# Patient Record
Sex: Female | Born: 2005
Health system: Southern US, Community
[De-identification: ages and names within clinical notes are randomized; demographics above are authoritative.]

## PROBLEM LIST (undated history)

## (undated) DIAGNOSIS — B373 Candidiasis of vulva and vagina: Secondary | ICD-10-CM

## (undated) DIAGNOSIS — B3731 Acute candidiasis of vulva and vagina: Secondary | ICD-10-CM

## (undated) DIAGNOSIS — J309 Allergic rhinitis, unspecified: Secondary | ICD-10-CM

## (undated) DIAGNOSIS — R1084 Generalized abdominal pain: Secondary | ICD-10-CM

## (undated) DIAGNOSIS — K219 Gastro-esophageal reflux disease without esophagitis: Secondary | ICD-10-CM

## (undated) DIAGNOSIS — F32A Depression, unspecified: Secondary | ICD-10-CM

## (undated) DIAGNOSIS — R1319 Other dysphagia: Secondary | ICD-10-CM

## (undated) DIAGNOSIS — T7840XA Allergy, unspecified, initial encounter: Secondary | ICD-10-CM

## (undated) DIAGNOSIS — B359 Dermatophytosis, unspecified: Secondary | ICD-10-CM

## (undated) DIAGNOSIS — F329 Major depressive disorder, single episode, unspecified: Secondary | ICD-10-CM

## (undated) HISTORY — DX: Dermatophytosis, unspecified: B35.9

## (undated) HISTORY — DX: Acute candidiasis of vulva and vagina: B37.31

## (undated) HISTORY — DX: Allergy, unspecified, initial encounter: T78.40XA

## (undated) HISTORY — DX: Candidiasis of vulva and vagina: B37.3

## (undated) HISTORY — PX: TYMPANOPLASTY WITH GRAFT: SHX6567

## (undated) HISTORY — PX: TYMPANOSTOMY TUBE PLACEMENT: SHX32

---
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
## 2023-01-11 ENCOUNTER — Encounter: Payer: Self-pay | Admitting: Gastroenterology

## 2023-01-11 ENCOUNTER — Ambulatory Visit (INDEPENDENT_AMBULATORY_CARE_PROVIDER_SITE_OTHER): Payer: Medicaid Other | Admitting: Gastroenterology

## 2023-01-11 VITALS — BP 135/81 | HR 72 | Temp 98.4°F | Ht 64.0 in | Wt 171.5 lb

## 2023-01-11 DIAGNOSIS — R195 Other fecal abnormalities: Secondary | ICD-10-CM

## 2023-01-11 DIAGNOSIS — R197 Diarrhea, unspecified: Secondary | ICD-10-CM

## 2023-01-11 MED ORDER — NA SULFATE-K SULFATE-MG SULF 17.5-3.13-1.6 GM/177ML PO SOLN: 1.0000 | Freq: Once | ORAL | 0 refills | Status: AC

## 2023-01-11 NOTE — Addendum Note (Signed)
Addended by: Roena Malady on: 01/11/2023 04:50 PM   Modules accepted: Orders

## 2023-01-11 NOTE — Progress Notes (Signed)
Gastroenterology Consultation  Referring Provider:     Danelle Berry, PA-C Primary Care Physician:  Danelle Berry, PA-C Primary Gastroenterologist:  Dr. Servando Snare     Reason for Consultation:     Diarrhea and dysphagia        HPI:   Brandy Molina is a 17 y.o. y/o female referred for consultation & management of diarrhea and dysphagia by Dr. Danelle Berry, PA-C.  This patient comes in today after being seen by her primary care provider with dysphagia that she states has completely resolved.  The patient also had some lower abdominal pain and diarrhea and had a CT scan and fecal calprotectin sent off that showed:  IMPRESSION: 1. No acute intra-abdominal process. 2. Hepatic steatosis.  Component     Latest Ref Rng 11/23/2022  Calprotectin     mcg/g 140 (H)    The patient reports that her symptoms have greatly improved.  She does report that she has some bright red blood on the toilet paper but otherwise no blood mixed with the stool . Past Medical History:  Diagnosis Date   Allergy    Candidiasis, vulva    Dermatophytosis     Past Surgical History:  Procedure Laterality Date   TYMPANOPLASTY WITH GRAFT Left    TYMPANOSTOMY TUBE PLACEMENT Bilateral     Prior to Admission medications   Medication Sig Start Date End Date Taking? Authorizing Provider  fluconazole (DIFLUCAN) 150 MG tablet Take 1 tablet (150 mg total) by mouth every 3 (three) days as needed (for vaginal itching/yeast infection sx). 10/30/22   Berniece Salines, FNP  medroxyPROGESTERone (DEPO-PROVERA) 150 MG/ML injection Inject 1 mL (150 mg total) into the muscle every 3 (three) months. Patient not taking: Reported on 10/29/2022 07/21/21   Danelle Berry, PA-C  naproxen sodium (ALEVE) 220 MG tablet Take 1 tablet (220 mg total) by mouth 2 (two) times daily as needed. Start 2 Days before Menstrual Cycle begins Patient not taking: Reported on 10/29/2022 03/31/17   Doren Custard, FNP  omeprazole (PRILOSEC) 20 MG capsule Take 1  capsule (20 mg total) by mouth daily. 10/29/22   Berniece Salines, FNP  ondansetron (ZOFRAN-ODT) 4 MG disintegrating tablet Take 1 tablet (4 mg total) by mouth every 8 (eight) hours as needed for nausea or vomiting. 10/29/22   Berniece Salines, FNP  FLUoxetine (PROZAC) 10 MG tablet Take 1 tablet (10 mg total) by mouth daily. 06/14/19 06/04/20  Alba Cory, MD  Norgestimate-Ethinyl Estradiol Triphasic 0.18/0.215/0.25 MG-25 MCG tab Take 1 tablet by mouth daily. 06/14/19 06/04/20  Alba Cory, MD    Family History  Problem Relation Age of Onset   Hypertension Father    Depression Father    Depression Mother      Social History   Tobacco Use   Smoking status: Never   Smokeless tobacco: Never  Vaping Use   Vaping status: Every Day   Substances: Nicotine, Flavoring  Substance Use Topics   Alcohol use: No    Alcohol/week: 0.0 standard drinks of alcohol   Drug use: No    Allergies as of 01/11/2023 - Review Complete 10/29/2022  Allergen Reaction Noted   Amoxapine and related  11/30/2014   Amoxicillin  05/15/2015    Review of Systems:    All systems reviewed and negative except where noted in HPI.   Physical Exam:  There were no vitals taken for this visit. No LMP recorded. General:   Alert,  Well-developed, well-nourished, pleasant and cooperative in  NAD Head:  Normocephalic and atraumatic. Eyes:  Sclera clear, no icterus.   Conjunctiva pink. Ears:  Normal auditory acuity. Neurologic:  Alert and oriented x3;  grossly normal neurologically. Skin:  Intact without significant lesions or rashes.  No jaundice. Psych:  Alert and cooperative. Normal mood and affect.  Imaging Studies: No results found.  Assessment and Plan:   Brandy Molina is a 17 y.o. y/o female who comes in with a history of symptoms that sound like irritable bowel syndrome that have improved.  The patient also had dysphagia that has also improved.  The patient was found to have a positive fecal  calprotectin.  Because of the elevated fecal calprotectin the patient will be set up for colonoscopy.  The patient has been explained the plan and agrees with it.    Midge Minium, MD. Clementeen Graham    Note: This dictation was prepared with Dragon dictation along with smaller phrase technology. Any transcriptional errors that result from this process are unintentional.

## 2023-01-21 ENCOUNTER — Encounter: Payer: Self-pay | Admitting: Gastroenterology

## 2023-01-28 NOTE — Anesthesia Preprocedure Evaluation (Addendum)
Anesthesia Evaluation  Patient identified by MRN, date of birth, ID band Patient awake    Reviewed: Allergy & Precautions, H&P , NPO status , Patient's Chart, lab work & pertinent test results  Airway Mallampati: II  TM Distance: >3 FB Neck ROM: Full    Dental no notable dental hx.    Pulmonary Current Smoker   Pulmonary exam normal breath sounds clear to auscultation       Cardiovascular negative cardio ROS Normal cardiovascular exam Rhythm:Regular Rate:Normal     Neuro/Psych  PSYCHIATRIC DISORDERS  Depression    negative neurological ROS  negative psych ROS   GI/Hepatic Neg liver ROS,GERD  ,,  Endo/Other  negative endocrine ROS    Renal/GU negative Renal ROS  negative genitourinary   Musculoskeletal negative musculoskeletal ROS (+)    Abdominal   Peds negative pediatric ROS (+)  Hematology negative hematology ROS (+)   Anesthesia Other Findings Allergy  Dermatophytosis  GERD (gastroesophageal reflux disease) Depression  Allergic rhinitis Esophageal dysphagia  Generalized abdominal pain Major depressive disorder Vapes and uses marijuana, used THC on Saturday, vaped today    Reproductive/Obstetrics negative OB ROS                              Anesthesia Physical Anesthesia Plan  ASA: 2  Anesthesia Plan:    Post-op Pain Management:    Induction:   PONV Risk Score and Plan:   Airway Management Planned:   Additional Equipment:   Intra-op Plan:   Post-operative Plan:   Informed Consent:   Plan Discussed with:   Anesthesia Plan Comments:          Anesthesia Quick Evaluation

## 2023-01-29 ENCOUNTER — Encounter: Payer: Self-pay | Admitting: Gastroenterology

## 2023-02-01 ENCOUNTER — Other Ambulatory Visit: Payer: Self-pay

## 2023-02-01 ENCOUNTER — Encounter
Admission: RE | Disposition: A | Discharge status: Home / Self Care | Payer: Self-pay | Source: Home / Self Care | Attending: Gastroenterology

## 2023-02-01 ENCOUNTER — Ambulatory Visit
Admission: RE | Admit: 2023-02-01 | Discharge: 2023-02-01 | Disposition: A | Discharge status: Home / Self Care | Payer: 59 | Attending: Gastroenterology | Admitting: Gastroenterology

## 2023-02-01 ENCOUNTER — Ambulatory Visit: Payer: 59 | Admitting: Anesthesiology

## 2023-02-01 ENCOUNTER — Encounter: Payer: Self-pay | Admitting: Gastroenterology

## 2023-02-01 DIAGNOSIS — F329 Major depressive disorder, single episode, unspecified: Secondary | ICD-10-CM | POA: Insufficient documentation

## 2023-02-01 DIAGNOSIS — F17290 Nicotine dependence, other tobacco product, uncomplicated: Secondary | ICD-10-CM | POA: Diagnosis not present

## 2023-02-01 DIAGNOSIS — K219 Gastro-esophageal reflux disease without esophagitis: Secondary | ICD-10-CM | POA: Insufficient documentation

## 2023-02-01 DIAGNOSIS — F32A Depression, unspecified: Secondary | ICD-10-CM | POA: Insufficient documentation

## 2023-02-01 DIAGNOSIS — K6389 Other specified diseases of intestine: Secondary | ICD-10-CM | POA: Diagnosis not present

## 2023-02-01 DIAGNOSIS — R197 Diarrhea, unspecified: Secondary | ICD-10-CM

## 2023-02-01 DIAGNOSIS — R195 Other fecal abnormalities: Secondary | ICD-10-CM

## 2023-02-01 DIAGNOSIS — F17210 Nicotine dependence, cigarettes, uncomplicated: Secondary | ICD-10-CM | POA: Diagnosis not present

## 2023-02-01 DIAGNOSIS — K641 Second degree hemorrhoids: Secondary | ICD-10-CM | POA: Insufficient documentation

## 2023-02-01 HISTORY — PX: COLONOSCOPY WITH PROPOFOL: SHX5780

## 2023-02-01 HISTORY — DX: Other dysphagia: R13.19

## 2023-02-01 HISTORY — PX: BIOPSY: SHX5522

## 2023-02-01 HISTORY — DX: Depression, unspecified: F32.A

## 2023-02-01 HISTORY — DX: Major depressive disorder, single episode, unspecified: F32.9

## 2023-02-01 HISTORY — DX: Allergic rhinitis, unspecified: J30.9

## 2023-02-01 HISTORY — DX: Generalized abdominal pain: R10.84

## 2023-02-01 HISTORY — DX: Gastro-esophageal reflux disease without esophagitis: K21.9

## 2023-02-01 LAB — POCT PREGNANCY, URINE: Preg Test, Ur: NEGATIVE

## 2023-02-01 SURGERY — COLONOSCOPY WITH PROPOFOL
Anesthesia: General | Site: Rectum

## 2023-02-01 MED ORDER — LIDOCAINE HCL (CARDIAC) PF 100 MG/5ML IV SOSY
PREFILLED_SYRINGE | INTRAVENOUS | Status: DC | PRN
  Administered 2023-02-01: 60 mg via INTRAVENOUS

## 2023-02-01 MED ORDER — LACTATED RINGERS IV SOLN: INTRAVENOUS | Status: DC

## 2023-02-01 MED ORDER — SODIUM CHLORIDE 0.9 % IV SOLN: INTRAVENOUS | Status: DC

## 2023-02-01 MED ORDER — PROPOFOL 10 MG/ML IV BOLUS
INTRAVENOUS | Status: DC | PRN
  Administered 2023-02-01: 80 mg via INTRAVENOUS

## 2023-02-01 MED ORDER — STERILE WATER FOR IRRIGATION IR SOLN
Status: DC | PRN
  Administered 2023-02-01: 1

## 2023-02-01 MED ORDER — PROPOFOL 500 MG/50ML IV EMUL
INTRAVENOUS | Status: DC | PRN
  Administered 2023-02-01: 140 ug/kg/min via INTRAVENOUS

## 2023-02-01 MED ORDER — DEXMEDETOMIDINE HCL IN NACL 80 MCG/20ML IV SOLN
INTRAVENOUS | Status: DC | PRN
  Administered 2023-02-01: 8 ug via INTRAVENOUS

## 2023-02-01 SURGICAL SUPPLY — 21 items

## 2023-02-01 NOTE — H&P (Signed)
Brandy Minium, MD [facility] [address], [address] [place], [place] [address] Phone:[phone] Fax : [phone]  Primary Care Physician:  Brandy Berry, PA-C Primary Gastroenterologist:  Dr. Servando Molina  Pre-Procedure History & Physical: HPI:  Brandy Molina is a 17 y.o. female is here for an colonoscopy.   Past Medical History:  Diagnosis Date   Allergic rhinitis    Allergy    Candidiasis, vulva    Depression    Dermatophytosis    Esophageal dysphagia    Generalized abdominal pain    GERD (gastroesophageal reflux disease)    Major depressive disorder     Past Surgical History:  Procedure Laterality Date   TYMPANOPLASTY WITH GRAFT Left    TYMPANOSTOMY TUBE PLACEMENT Bilateral     Prior to Admission medications   Medication Sig Start Date End Date Taking? Authorizing Provider  ondansetron (ZOFRAN-ODT) 4 MG disintegrating tablet Take 1 tablet (4 mg total) by mouth every 8 (eight) hours as needed for nausea or vomiting. 10/29/22  Yes Brandy Salines, FNP  omeprazole (PRILOSEC) 20 MG capsule Take 1 capsule (20 mg total) by mouth daily. Patient not taking: Reported on 01/21/2023 10/29/22   Brandy Salines, FNP  FLUoxetine (PROZAC) 10 MG tablet Take 1 tablet (10 mg total) by mouth daily. 06/14/19 06/04/20  Brandy Cory, MD  Norgestimate-Ethinyl Estradiol Triphasic 0.18/0.215/0.25 MG-25 MCG tab Take 1 tablet by mouth daily. 06/14/19 06/04/20  Brandy Cory, MD    Allergies as of 01/11/2023 - Review Complete 01/11/2023  Allergen Reaction Noted   Amoxapine and related  11/30/2014   Amoxicillin  05/15/2015    Family History  Problem Relation Age of Onset   Hypertension Father    Depression Father    Depression Mother     Social History   Socioeconomic History   Marital status: Single    Spouse name: Not on file   Number of children: 0   Years of education: Not on file   Highest education level: Not on file  Occupational History   Occupation: student   Tobacco Use    Smoking status: Every Day    Current packs/day: 0.05    Average packs/day: 0.1 packs/day for 1 year (0.1 ttl pk-yrs)    Types: Cigarettes    Start date: 01/14/2022   Smokeless tobacco: Never  Vaping Use   Vaping status: Every Day   Substances: Nicotine, Flavoring  Substance and Sexual Activity   Alcohol use: No    Alcohol/week: 0.0 standard drinks of alcohol   Drug use: No   Sexual activity: Yes  Other Topics Concern   Not on file  Social History Narrative   Lives with parents and sister   Started working as Production assistant, radio at age 75       Social Determinants of Health   Financial Resource Strain: Low Risk  (07/21/2021)   Overall Financial Resource Strain (CARDIA)    Difficulty of Paying Living Expenses: Not hard at all  Food Insecurity: No Food Insecurity (07/21/2021)   Hunger Vital Sign    Worried About Running Out of Food in the Last Year: Never true    Ran Out of Food in the Last Year: Never true  Transportation Needs: No Transportation Needs (07/21/2021)   PRAPARE - Administrator, Civil Service (Medical): No    Lack of Transportation (Non-Medical): No  Physical Activity: Sufficiently Active (07/21/2021)   Exercise Vital Sign    Days of Exercise per Week: 5 days    Minutes  of Exercise per Session: 30 min  Stress: No Stress Concern Present (07/21/2021)   [employer] of Occupational Health - Occupational Stress Questionnaire    Feeling of Stress : Not at all  Social Connections: Unknown (07/21/2021)   Social Connection and Isolation Panel [NHANES]    Frequency of Communication with Friends and Family: Never    Frequency of Social Gatherings with Friends and Family: Never    Attends Religious Services: Never    Database administrator or Organizations: No    Attends Banker Meetings: Never    Marital Status: Not on file  Intimate Partner Violence: Not At Risk (07/21/2021)   Humiliation, Afraid, Rape, and Kick questionnaire    Fear of Current or Ex-Partner: No     Emotionally Abused: No    Physically Abused: No    Sexually Abused: No    Review of Systems: See HPI, otherwise negative ROS  Physical Exam: BP 111/74   Pulse (!) 26   Temp 98.8 F (37.1 C) (Temporal)   Ht 5' 4.02" (1.626 m)   Wt 76.5 kg   LMP 01/04/2023 (Approximate)   SpO2 97%   BMI 28.94 kg/m  General:   Alert,  pleasant and cooperative in NAD Head:  Normocephalic and atraumatic. Neck:  Supple; no masses or thyromegaly. Lungs:  Clear throughout to auscultation.    Heart:  Regular rate and rhythm. Abdomen:  Soft, nontender and nondistended. Normal bowel sounds, without guarding, and without rebound.   Neurologic:  Alert and  oriented x4;  grossly normal neurologically.  Impression/Plan: Brandy Molina is here for an colonoscopy to be performed for diarrhea  Risks, benefits, limitations, and alternatives regarding  colonoscopy have been reviewed with the patient.  Questions have been answered.  All parties agreeable.   Brandy Minium, MD  02/01/2023, 9:43 AM

## 2023-02-01 NOTE — Anesthesia Postprocedure Evaluation (Signed)
Anesthesia Post Note  Patient: Brandy Molina  Procedure(s) Performed: COLONOSCOPY WITH PROPOFOL BIOPSY (Rectum)  Patient location during evaluation: PACU Anesthesia Type: General Level of consciousness: awake and alert Pain management: pain level controlled Vital Signs Assessment: post-procedure vital signs reviewed and stable Respiratory status: spontaneous breathing, nonlabored ventilation, respiratory function stable and patient connected to nasal cannula oxygen Cardiovascular status: blood pressure returned to baseline and stable Postop Assessment: no apparent nausea or vomiting Anesthetic complications: no   No notable events documented.   Last Vitals:  Vitals:   02/01/23 1030 02/01/23 1034  BP: 105/65 113/65  Pulse: (!) 54 (!) 55  Resp: 19 20  Temp:    SpO2: 98%     Last Pain:  Vitals:   02/01/23 1018  TempSrc: Temporal  PainSc:                  Marisue Humble

## 2023-02-01 NOTE — Transfer of Care (Signed)
Immediate Anesthesia Transfer of Care Note  Patient: Brandy Molina  Procedure(s) Performed: COLONOSCOPY WITH PROPOFOL BIOPSY (Rectum)  Patient Location: PACU  Anesthesia Type:General  Level of Consciousness: awake, alert , and oriented  Airway & Oxygen Therapy: Patient Spontanous Breathing  Post-op Assessment: Report given to RN and Post -op Vital signs reviewed and stable  Post vital signs: Reviewed and stable  Last Vitals:  Vitals Value Taken Time  BP 92/54 02/01/23 1018  Temp 36.6 C 02/01/23 1018  Pulse 55 02/01/23 1027  Resp 19 02/01/23 1027  SpO2 98 % 02/01/23 1027  Vitals shown include unfiled device data.  Last Pain:  Vitals:   02/01/23 1018  TempSrc: Temporal  PainSc:          Complications: No notable events documented.

## 2023-02-01 NOTE — Op Note (Signed)
[facility] Gastroenterology Patient Name: Brandy Molina Procedure Date: 02/01/2023 9:56 AM MRN: [id number] Account #: [id number] Date of Birth: 05-27-2005 Admit Type: Outpatient Age: 17 Room: [facility] OR ROOM 01 Gender: Female Note Status: Finalized Instrument Name: [id number] Procedure:             Colonoscopy Indications:           Change in bowel habits Providers:             Midge Minium MD, MD Referring MD:          Danelle Berry (Referring MD) Medicines:             Propofol per Anesthesia Complications:         No immediate complications. Procedure:             Pre-Anesthesia Assessment:                        - Prior to the procedure, a History and Physical was                         performed, and patient medications and allergies were                         reviewed. The patient's tolerance of previous                         anesthesia was also reviewed. The risks and benefits                         of the procedure and the sedation options and risks                         were discussed with the patient. All questions were                         answered, and informed consent was obtained. Prior                         Anticoagulants: The patient has taken no anticoagulant                         or antiplatelet agents. ASA Grade Assessment: II - A                         patient with mild systemic disease. After reviewing                         the risks and benefits, the patient was deemed in                         satisfactory condition to undergo the procedure.                        After obtaining informed consent, the colonoscope was                         passed under direct vision. Throughout the procedure,  the patient's blood pressure, pulse, and oxygen                         saturations were monitored continuously. The                         Colonoscope was introduced through the anus and                          advanced to the the terminal ileum. The colonoscopy                         was performed without difficulty. The patient                         tolerated the procedure well. The quality of the bowel                         preparation was good. Findings:      The perianal and digital rectal examinations were normal.      The terminal ileum appeared normal. Biopsies were taken with a cold       forceps for histology.      Non-bleeding internal hemorrhoids were found during retroflexion. The       hemorrhoids were Grade II (internal hemorrhoids that prolapse but reduce       spontaneously).      Random biopsies were obtained with cold forceps for histology randomly       in the entire colon. Impression:            - The examined portion of the ileum was normal.                         Biopsied.                        - Non-bleeding internal hemorrhoids.                        - Random biopsies were obtained in the entire colon. Recommendation:        - Discharge patient to home.                        - Resume previous diet.                        - Continue present medications.                        - Await pathology results. Procedure Code(s):     --- Professional ---                        [phone], Colonoscopy, flexible; with biopsy, single or                         multiple Diagnosis Code(s):     --- Professional ---                        R19.4, Change in bowel habit CPT copyright 2022 American Medical Association. All rights reserved. The  codes documented in this report are preliminary and upon coder review may  be revised to meet current compliance requirements. Midge Minium MD, MD 02/01/2023 10:14:22 AM This report has been signed electronically. Number of Addenda: 0 Note Initiated On: 02/01/2023 9:56 AM Scope Withdrawal Time: 0 hours 6 minutes 54 seconds  Total Procedure Duration: 0 hours 9 minutes 51 seconds  Estimated Blood Loss:  Estimated blood loss: none.       [facility]

## 2023-02-02 ENCOUNTER — Encounter: Payer: Self-pay | Admitting: Gastroenterology

## 2023-02-03 LAB — SURGICAL PATHOLOGY

## 2023-02-04 ENCOUNTER — Encounter: Payer: Self-pay | Admitting: Gastroenterology

## 2023-09-09 NOTE — Progress Notes (Signed)
 Assessment and Plan:   Brandy Molina was seen today for establish care.  Diagnoses and all orders for this visit:  Dysmenorrhea Counseling for birth control, oral contraceptives History of ovarian cyst         -     Hx of 3.5 cm complex right ovarian cyst with intermediate internal density, most likely a hemorrhagic cyst. Noted on CT in 2024       -     Given patient's heavy, painful periods--referral placed for pelvic ultrasound to evaluate condition further.       -     Nonbiased contraceptive counseling provided to patient.  After patient centered discussion on risks, benefits and indications of various methods--patient agreeable to start contraceptive patch.       -     Reviewed dosing, side effects. -     US  Endovaginal (Non-OB); Future -     norelgestromin -ethinyl estradiol  (XULANE) 150-35 mcg/24 hr; Place 1 patch on the skin once a week.  Fatty liver        -     Advised cutting back on fatty foods, processed foods.  Increasing activity, decreasing sodas and sugar sweetened beverages. -     Labs: Comprehensive Metabolic Panel, A1c, lipid       -     ALT 33, lipase 179, A1c in past--negative  Encounter for hepatitis C screening test for low risk patient  -     Hepatitis C antibody  Screening examination for STD (sexually transmitted disease)        -     Tests for HIV, RPR, NAAT: : pending. Increase safe sex practices. RTC new partners, new symptoms, or concerns.   -     Chlamydia/Gonorrhoeae NAA -     HIV Antigen/Antibody Combo -     Syphilis Screen  History of depression        -Patient feels mental health stable.  Declined referral to mental health professional--continue to monitor.  Patient feels symptomatic from potential pinworm infection, advised over-the-counter treatment if needed. Information provided. Barriers to recommended plan: None identified  Return for 3-4 months .   Subjective:   HPI: Brandy Molina is a 18 y.o. female here for  Establish Care (Believes they may have pin worm. Work with children).  History of Present Illness   Brandy Molina is a 18 year old female who presents to establish care and discuss concerns about pinworm exposure and menstrual irregularities.  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 There are no known outbreaks at her workplace, but she is worried about contracting it from the children. She inquires about symptoms and over-the-counter treatments for pinworm, noting that she has not experienced any symptoms such as rectal itching or visible worms in her stool.  Dysmenorrhea: She has a history of painful and heavy menstrual periods, which have been problematic since she turned 16. Her periods are irregular, with some months missed and others occurring twice. The pain is most severe between the first and third day of her cycle, radiating to her lower back, and is accompanied by heavy bleeding that sometimes results in bleeding through her clothes. She experiences nausea and has previously vomited due to the pain. She manages the pain by sitting in a bath and occasionally uses ibuprofen , which provides minimal relief. She misses about three to four hours of work due to the pain. In past, pt has not  tried a hormonal method to control symptoms, but open to trial ling a method.  She has a history of a CT scan revealing fatty liver and a colonoscopy that was negative for endometriosis. She reports that her CT scan showed fatty liver, and she was not told of any cysts on her ovaries. She is interested in exploring options to manage her menstrual symptoms.  Her social history includes daily marijuana use, primarily to manage period pain and stomach issues, and occasional use of delta-8 THC. She also vapes flavored nicotine. She reports a diet high in junk food and soda, with occasional fruits and vegetables. She has a history of  inconsistent bowel movements, with frequent diarrhea and occasional solid stools.      1 female partner in past 36M--would like STD screening Hx of depression: pt states she has hx of depression, feels mental health stable, declined referral to mental health professional   I have reviewed past medical, surgical, medications, allergies, social and family histories today and updated them in Epic where appropriate.  ROS:  Review of Systems  Constitutional: Negative.   HENT: Negative.    Respiratory: Negative.    Cardiovascular: Negative.   Gastrointestinal:  Positive for diarrhea.  Genitourinary:  Positive for pelvic pain and vaginal bleeding.  Psychiatric/Behavioral: Negative.    All other systems reviewed and are negative.    Wt Readings from Last 3 Encounters:  09/09/23 67.5 kg (148 lb 14.4 oz)         Review of systems negative unless otherwise noted as per HPI.    Objective:   Vitals:   09/09/23 1041  BP: 114/70  Pulse: 64  Temp: 36.6 C (97.8 F)  SpO2: 98%   There is no height or weight on file to calculate BMI.  Physical Exam Vitals and nursing note reviewed.  Constitutional:      Appearance: Normal appearance.  HENT:     Head: Normocephalic and atraumatic.   Musculoskeletal:        General: Normal range of motion.   Skin:    General: Skin is warm.     Capillary Refill: Capillary refill takes less than 2 seconds.   Neurological:     General: No focal deficit present.     Mental Status: She is alert and oriented to person, place, and time. Mental status is at baseline.   Psychiatric:        Mood and Affect: Mood normal.        Behavior: Behavior normal.        Thought Content: Thought content normal.        Judgment: Judgment normal.       Medication adherence and barriers to the treatment plan have been addressed. Opportunities to optimize healthy behaviors have been discussed. Patient / caregiver voiced understanding.  I personally spent 30 minutes  face-to-face and non-face-to-face in the care of this patient, which includes all pre, intra, and post visit time on the date of service. Alfonso Grow, DNP, FNP-C [facility] [phone] (F)  Note - This record has been created using [employer]. Chart creation errors have been sought, but may not always have been located. Such creation errors do not reflect on the standard of medical care.

## 2023-10-22 ENCOUNTER — Encounter: Payer: Self-pay | Admitting: Emergency Medicine

## 2023-10-22 ENCOUNTER — Ambulatory Visit
Admission: EM | Admit: 2023-10-22 | Discharge: 2023-10-22 | Disposition: A | Discharge status: Home / Self Care | Attending: Emergency Medicine | Admitting: Emergency Medicine

## 2023-10-22 DIAGNOSIS — R1319 Other dysphagia: Secondary | ICD-10-CM | POA: Insufficient documentation

## 2023-10-22 DIAGNOSIS — N898 Other specified noninflammatory disorders of vagina: Secondary | ICD-10-CM | POA: Insufficient documentation

## 2023-10-22 DIAGNOSIS — R300 Dysuria: Secondary | ICD-10-CM | POA: Diagnosis present

## 2023-10-22 DIAGNOSIS — Z112 Encounter for screening for other bacterial diseases: Secondary | ICD-10-CM | POA: Insufficient documentation

## 2023-10-22 DIAGNOSIS — J309 Allergic rhinitis, unspecified: Secondary | ICD-10-CM | POA: Insufficient documentation

## 2023-10-22 DIAGNOSIS — F32A Depression, unspecified: Secondary | ICD-10-CM | POA: Diagnosis not present

## 2023-10-22 DIAGNOSIS — K219 Gastro-esophageal reflux disease without esophagitis: Secondary | ICD-10-CM | POA: Diagnosis not present

## 2023-10-22 DIAGNOSIS — Z202 Contact with and (suspected) exposure to infections with a predominantly sexual mode of transmission: Secondary | ICD-10-CM | POA: Diagnosis present

## 2023-10-22 DIAGNOSIS — R1084 Generalized abdominal pain: Secondary | ICD-10-CM | POA: Diagnosis not present

## 2023-10-22 DIAGNOSIS — Z113 Encounter for screening for infections with a predominantly sexual mode of transmission: Secondary | ICD-10-CM | POA: Diagnosis not present

## 2023-10-22 LAB — URINALYSIS, W/ REFLEX TO CULTURE (INFECTION SUSPECTED)
Glucose, UA: NEGATIVE mg/dL
Hgb urine dipstick: NEGATIVE
Ketones, ur: NEGATIVE mg/dL
Leukocytes,Ua: NEGATIVE
Nitrite: NEGATIVE
Specific Gravity, Urine: >1.03 — ABNORMAL HIGH (ref 1.005–1.030)
Squamous Epithelial / HPF: >50 /HPF (ref 0–5)
pH: 5.5 (ref 5.0–8.0)

## 2023-10-22 LAB — HIV ANTIBODY (ROUTINE TESTING W REFLEX): HIV Screen 4th Generation wRfx: NONREACTIVE

## 2023-10-22 NOTE — ED Triage Notes (Signed)
 Pt c/o possible chlamydia.  Pt states that she feels off  Pt states that she has a new sexual partner and is unsure if he is having any symptoms.  Pt states that she has vaginal itching, burning, and urinary frequency.  Pt states that she has had chlamydia before and her symptoms are the same.

## 2023-10-22 NOTE — Discharge Instructions (Addendum)
 Your urinalysis did not show any evidence of UTI.  Your vaginal swab is pending and will be back in the next several days.  Your blood work is also pending and likewise will be back in the next several days.  If you test positive for any infection you will be contacted by phone and treatment options will be provided.  If all of your results are negative they will appear in your MyChart.  Abstain from intercourse, or at least unprotected intercourse until after your test results are back and any treatment has been completed.  Your partner needs to also be tested and treated if they are positive for an infection.  If you have any increase in pain, develop nausea or vomiting, or develop fever you need to go to the ER for evaluation.

## 2023-10-22 NOTE — ED Provider Notes (Signed)
 [facility]    CSN: [id number] Arrival date & time: 10/22/23  0912      History   Chief Complaint Chief Complaint  Patient presents with   Exposure to STD    HPI Brandy Molina is a 18 y.o. female.   HPI  18 year old female with past medical history significant for MDD, GERD, generalized abdominal pain, esophageal dysphagia, depression, and allergic rhinitis presents for evaluation of genitourinary symptoms that been going on for the past few days this includes some mild dysuria along with urinary urgency and frequency but no hematuria.  She does report that 1 day when she wiped that she was spotting but it was only 1 episode and has not returned.  She is experiencing a white vaginal discharge and denies any odor.  She has had chlamydia before and she reports that she feels off she would like to be checked for chlamydia.  She has a new sexual partner who told her that he was free of infection.  The had 1 episode of unprotected sex.  She denies any fever, abdominal pain, nausea, or vomiting.  No recent hot tub exposure, antibiotic use, or bubble bath.  Past Medical History:  Diagnosis Date   Allergic rhinitis    Allergy    Candidiasis, vulva    Depression    Dermatophytosis    Esophageal dysphagia    Generalized abdominal pain    GERD (gastroesophageal reflux disease)    Major depressive disorder     Patient Active Problem List   Diagnosis Date Noted   Elevated fecal calprotectin 02/01/2023   Diarrhea 02/01/2023   Major depression 08/15/2020   Dysmenorrhea in adolescent 06/03/2018   Allergic rhinitis 05/15/2015    Past Surgical History:  Procedure Laterality Date   BIOPSY  02/01/2023   Procedure: BIOPSY;  Surgeon: Jinny Carmine, MD;  Location: [facility];  Service: Endoscopy;;   COLONOSCOPY WITH PROPOFOL  N/A 02/01/2023   Procedure: COLONOSCOPY WITH PROPOFOL ;  Surgeon: Jinny Carmine, MD;  Location: [facility];  Service: Endoscopy;   Laterality: N/A;   TYMPANOPLASTY WITH GRAFT Left    TYMPANOSTOMY TUBE PLACEMENT Bilateral     OB History   No obstetric history on file.      Home Medications    Prior to Admission medications   Medication Sig Start Date End Date Taking? Authorizing Provider  omeprazole  (PRILOSEC) 20 MG capsule Take 1 capsule (20 mg total) by mouth daily. 10/29/22  Yes Gareth Mliss FALCON, FNP  ondansetron  (ZOFRAN -ODT) 4 MG disintegrating tablet Take 1 tablet (4 mg total) by mouth every 8 (eight) hours as needed for nausea or vomiting. 10/29/22  Yes Gareth Mliss FALCON, FNP  FLUoxetine  (PROZAC ) 10 MG tablet Take 1 tablet (10 mg total) by mouth daily. 06/14/19 06/04/20  Sowles, Krichna, MD  Norgestimate-Ethinyl Estradiol  Triphasic 0.18/0.215/0.25 MG-25 MCG tab Take 1 tablet by mouth daily. 06/14/19 06/04/20  Sowles, Krichna, MD    Family History Family History  Problem Relation Age of Onset   Hypertension Father    Depression Father    Depression Mother     Social History Social History   Tobacco Use   Smoking status: Every Day    Current packs/day: 0.05    Average packs/day: 0.1 packs/day for 1.8 years (0.1 ttl pk-yrs)    Types: E-cigarettes, Cigarettes    Start date: 01/14/2022   Smokeless tobacco: Never  Vaping Use   Vaping status: Every Day   Substances: Nicotine, Flavoring  Substance Use Topics  Alcohol use: No    Alcohol/week: 0.0 standard drinks of alcohol   Drug use: No     Allergies   Amoxapine and related and Amoxicillin   Review of Systems Review of Systems  Constitutional:  Negative for fever.  Gastrointestinal:  Negative for abdominal pain, nausea and vomiting.  Genitourinary:  Positive for dysuria, frequency, urgency, vaginal discharge and vaginal pain. Negative for hematuria.  Musculoskeletal:  Negative for back pain.  Skin:  Negative for rash.     Physical Exam Triage Vital Signs ED Triage Vitals  Encounter Vitals Group     BP      Girls Systolic BP Percentile       Girls Diastolic BP Percentile      Boys Systolic BP Percentile      Boys Diastolic BP Percentile      Pulse      Resp      Temp      Temp src      SpO2      Weight      Height      Head Circumference      Peak Flow      Pain Score      Pain Loc      Pain Education      Exclude from Growth Chart    No data found.  Updated Vital Signs BP 115/74 (BP Location: Left Arm)   Pulse 60   Temp 97.8 F (36.6 C) (Oral)   Ht 5' 4 (1.626 m)   Wt 140 lb (63.5 kg)   LMP 10/08/2023   SpO2 97%   BMI 24.03 kg/m   Visual Acuity Right Eye Distance:   Left Eye Distance:   Bilateral Distance:    Right Eye Near:   Left Eye Near:    Bilateral Near:     Physical Exam Vitals and nursing note reviewed.  Constitutional:      Appearance: Normal appearance. She is not ill-appearing.  HENT:     Head: Normocephalic and atraumatic.  Cardiovascular:     Rate and Rhythm: Normal rate and regular rhythm.     Pulses: Normal pulses.     Heart sounds: Normal heart sounds. No murmur heard.    No friction rub. No gallop.  Pulmonary:     Effort: Pulmonary effort is normal.     Breath sounds: Normal breath sounds. No wheezing, rhonchi or rales.  Abdominal:     General: Abdomen is flat.     Palpations: Abdomen is soft.     Tenderness: There is no abdominal tenderness. There is no right CVA tenderness, left CVA tenderness, guarding or rebound.  Skin:    General: Skin is warm and dry.     Capillary Refill: Capillary refill takes less than 2 seconds.     Findings: No rash.  Neurological:     General: No focal deficit present.     Mental Status: She is alert and oriented to person, place, and time.      UC Treatments / Results  Labs (all labs ordered are listed, but only abnormal results are displayed) Labs Reviewed  URINALYSIS, W/ REFLEX TO CULTURE (INFECTION SUSPECTED) - Abnormal; Notable for the following components:      Result Value   APPearance HAZY (*)    Specific Gravity, Urine  >1.030 (*)    Bilirubin Urine SMALL (*)    Protein, ur TRACE (*)    Bacteria, UA MANY (*)    All other components  within normal limits  HIV ANTIBODY (ROUTINE TESTING W REFLEX)  RPR  CERVICOVAGINAL ANCILLARY ONLY    EKG   Radiology No results found.  Procedures Procedures (including critical care time)  Medications Ordered in UC Medications - No data to display  Initial Impression / Assessment and Plan / UC Course  I have reviewed the triage vital signs and the nursing notes.  Pertinent labs & imaging results that were available during my care of the patient were reviewed by me and considered in my medical decision making (see chart for details).   Patient is a pleasant, nontoxic-appearing 18 year old female presenting for evaluation genitourinary symptoms outlined HPI above.  She is concerned she may have an STI as she was new sexual partner and had 1 episode of unprotected sex.  Per her report, he told her that he was negative for infections.  She had recently been tested and she was negative for infections.  Her symptoms include burning to her vulvar area as well as some mild dysuria when voiding.  This is coupled with urgency and frequency and a white vaginal discharge without odor.  She did have 1 episode of spotting 1 day when she wiped but that has not returned and was going to 1 time.  In the exam room she denies any acute distress.  Her abdomen is soft, flat, nontender.  No CVA tenderness on exam.  I will order a vaginal cytology swab to test for gonorrhea, chlamydia, trichomonas, BV, and yeast.  I will also order a urinalysis to assess for the presence of UTI.  The patient is requesting blood work so I have ordered an HIV and RPR.  Urinalysis is hazy with a high specific gravity of >1.030, small bilirubin and trace protein but negative for leukocyte esterase or nitrates.  Significant skin cell contamination with greater than 50 squamous epithelials and many bacteria.  0-5 WBCs  and RBCs respectively.  I will discharge patient home with a diagnosis of vaginal discharge pending the results of her cytology swab.  I will encourage her to abstain from any intercourse, or at least unprotected intercourse, until after her results have come back and her partner has also been tested.   Final Clinical Impressions(s) / UC Diagnoses   Final diagnoses:  Vaginal discharge     Discharge Instructions      Your urinalysis did not show any evidence of UTI.  Your vaginal swab is pending and will be back in the next several days.  Your blood work is also pending and likewise will be back in the next several days.  If you test positive for any infection you will be contacted by phone and treatment options will be provided.  If all of your results are negative they will appear in your MyChart.  Abstain from intercourse, or at least unprotected intercourse until after your test results are back and any treatment has been completed.  Your partner needs to also be tested and treated if they are positive for an infection.  If you have any increase in pain, develop nausea or vomiting, or develop fever you need to go to the ER for evaluation.     ED Prescriptions   None    PDMP not reviewed this encounter.   Bernardino Ditch, NP 10/22/23 1032

## 2023-10-23 LAB — RPR: RPR Ser Ql: NONREACTIVE

## 2023-10-25 ENCOUNTER — Ambulatory Visit: Payer: Self-pay

## 2023-10-25 LAB — CERVICOVAGINAL ANCILLARY ONLY
Bacterial Vaginitis (gardnerella): NEGATIVE
Candida Glabrata: NEGATIVE
Candida Vaginitis: POSITIVE — AB
Chlamydia: NEGATIVE
Comment: NEGATIVE
Comment: NEGATIVE
Comment: NEGATIVE
Comment: NEGATIVE
Comment: NEGATIVE
Comment: NORMAL
Neisseria Gonorrhea: NEGATIVE
Trichomonas: NEGATIVE

## 2023-10-25 MED ORDER — FLUCONAZOLE 150 MG PO TABS: 150.0000 mg | ORAL_TABLET | Freq: Once | ORAL | 0 refills | Status: AC

## 2023-11-11 ENCOUNTER — Encounter: Payer: Self-pay | Admitting: Emergency Medicine

## 2023-11-11 ENCOUNTER — Ambulatory Visit
Admission: EM | Admit: 2023-11-11 | Discharge: 2023-11-11 | Disposition: A | Discharge status: Home / Self Care | Attending: Family Medicine | Admitting: Family Medicine

## 2023-11-11 DIAGNOSIS — Z202 Contact with and (suspected) exposure to infections with a predominantly sexual mode of transmission: Secondary | ICD-10-CM | POA: Insufficient documentation

## 2023-11-11 LAB — HIV ANTIBODY (ROUTINE TESTING W REFLEX): HIV Screen 4th Generation wRfx: NONREACTIVE

## 2023-11-11 NOTE — ED Provider Notes (Signed)
 [facility]    CSN: [id number] Arrival date & time: 11/11/23  1658      History   Chief Complaint Chief Complaint  Patient presents with   Exposure to STD     HPI HPI Brandy Molina is a 18 y.o. female.    Brandy Molina presents for STD testing after having unprotected sex with her partner.  She wants to be sure she is clean.  Denies known STI exposure.  Brandy Molina does use condoms regularly. She is  not currently pregnant and doesn't take any hormonal birth control. She is currently menstruating.   Patient's last menstrual period was 11/06/2023 (approximate).    - Abnormal vaginal discharge: no - vaginal odor: no - vaginal bleeding: yes, menstruating - Dysuria: no - Hematuria: no - Urinary urgency:no  - Urinary frequency: no  - Fever: no - Abdominal pain: cramping related to menstruation - Pelvic pain: no - Rash/Skin lesions/mouth ulcers: no - Nausea: no  - Vomiting: no  - Back Pain: no        Past Medical History:  Diagnosis Date   Allergic rhinitis    Allergy    Candidiasis, vulva    Depression    Dermatophytosis    Esophageal dysphagia    Generalized abdominal pain    GERD (gastroesophageal reflux disease)    Major depressive disorder     Patient Active Problem List   Diagnosis Date Noted   Elevated fecal calprotectin 02/01/2023   Diarrhea 02/01/2023   Major depression 08/15/2020   Dysmenorrhea in adolescent 06/03/2018   Allergic rhinitis 05/15/2015    Past Surgical History:  Procedure Laterality Date   BIOPSY  02/01/2023   Procedure: BIOPSY;  Surgeon: Jinny Carmine, MD;  Location: [facility];  Service: Endoscopy;;   COLONOSCOPY WITH PROPOFOL  N/A 02/01/2023   Procedure: COLONOSCOPY WITH PROPOFOL ;  Surgeon: Jinny Carmine, MD;  Location: [facility];  Service: Endoscopy;  Laterality: N/A;   TYMPANOPLASTY WITH GRAFT Left    TYMPANOSTOMY TUBE PLACEMENT Bilateral     OB History   No obstetric history on  file.      Home Medications    Prior to Admission medications   Medication Sig Start Date End Date Taking? Authorizing Provider  norelgestromin -ethinyl estradiol  (XULANE) 150-35 MCG/24HR transdermal patch Place 1 patch onto the skin. 09/09/23 09/08/24 Yes [provider]  omeprazole  (PRILOSEC) 20 MG capsule Take 1 capsule (20 mg total) by mouth daily. 10/29/22   Pender, Julie F, FNP  ondansetron  (ZOFRAN -ODT) 4 MG disintegrating tablet Take 1 tablet (4 mg total) by mouth every 8 (eight) hours as needed for nausea or vomiting. 10/29/22   Gareth Mliss FALCON, FNP  FLUoxetine  (PROZAC ) 10 MG tablet Take 1 tablet (10 mg total) by mouth daily. 06/14/19 06/04/20  Sowles, Krichna, MD  Norgestimate-Ethinyl Estradiol  Triphasic 0.18/0.215/0.25 MG-25 MCG tab Take 1 tablet by mouth daily. 06/14/19 06/04/20  Sowles, Krichna, MD    Family History Family History  Problem Relation Age of Onset   Hypertension Father    Depression Father    Depression Mother     Social History Social History   Tobacco Use   Smoking status: Former    Current packs/day: 0.05    Average packs/day: 0.1 packs/day for 1.8 years (0.1 ttl pk-yrs)    Types: E-cigarettes, Cigarettes    Start date: 01/14/2022   Smokeless tobacco: Never  Vaping Use   Vaping status: Every Day   Substances: Nicotine, Flavoring  Substance Use Topics  Alcohol use: No    Alcohol/week: 0.0 standard drinks of alcohol   Drug use: Yes    Types: Marijuana     Allergies   Amoxapine and related and Amoxicillin   Review of Systems Review of Systems: :negative unless otherwise stated in HPI.      Physical Exam Triage Vital Signs ED Triage Vitals  Encounter Vitals Group     BP 11/11/23 1747 113/70     Girls Systolic BP Percentile --      Girls Diastolic BP Percentile --      Boys Systolic BP Percentile --      Boys Diastolic BP Percentile --      Pulse Rate 11/11/23 1747 86     Resp 11/11/23 1747 16     Temp 11/11/23 1747 97.9 F  (36.6 C)     Temp Source 11/11/23 1747 Oral     SpO2 11/11/23 1747 96 %     Weight 11/11/23 1746 136 lb (61.7 kg)     Height --      Head Circumference --      Peak Flow --      Pain Score 11/11/23 1745 0     Pain Loc --      Pain Education --      Exclude from Growth Chart --    No data found.  Updated Vital Signs BP 113/70 (BP Location: Right Arm)   Pulse 86   Temp 97.9 F (36.6 C) (Oral)   Resp 16   Wt 61.7 kg   LMP 11/06/2023 (Approximate)   SpO2 96%   BMI 23.34 kg/m   Visual Acuity Right Eye Distance:   Left Eye Distance:   Bilateral Distance:    Right Eye Near:   Left Eye Near:    Bilateral Near:     Physical Exam GEN: well appearing female in no acute distress  CVS: well perfused  RESP: speaking in full sentences without pause    UC Treatments / Results  Labs (all labs ordered are listed, but only abnormal results are displayed) Labs Reviewed  RPR  HIV ANTIBODY (ROUTINE TESTING W REFLEX)  CERVICOVAGINAL ANCILLARY ONLY    EKG   Radiology No results found.  Procedures Procedures (including critical care time)  Medications Ordered in UC Medications - No data to display  Initial Impression / Assessment and Plan / UC Course  I have reviewed the triage vital signs and the nursing notes.  Pertinent labs & imaging results that were available during my care of the patient were reviewed by me and considered in my medical decision making (see chart for details).      Patient is a 18 y.o.Brandy Molina female  who presents for possible STD exposure  Overall patient is well-appearing and afebrile.  Vital signs stable.  Vaginal swab for  trichomonas, gonorrhea and chlamydia obtained.  Patient does not request prophylactic STI treatment.  HIV and syphilis testing obtained.  Safe sex and and pregnancy prevention discussed.   Return precautions including abdominal pain, fever, chills, nausea, or vomiting given. Discussed MDM, treatment plan and plan for follow-up  with patient who agrees with plan.        Final Clinical Impressions(s) / UC Diagnoses   Final diagnoses:  Possible exposure to STD     Discharge Instructions       Your STD test results will be available in the next 72 hours. If positive, someone will contact you.  You should see your results in  your MyChart account.       ED Prescriptions   None    PDMP not reviewed this encounter.   Race Latour, DO 11/11/23 1758

## 2023-11-11 NOTE — Discharge Instructions (Signed)
 Your STD test results will be available in the next 72 hours. If positive, someone will contact you.  You should see your results in your MyChart account.

## 2023-11-11 NOTE — ED Triage Notes (Signed)
 Pt presents for STD testing. She denies any symptoms or known exposure.

## 2023-11-12 ENCOUNTER — Ambulatory Visit (HOSPITAL_COMMUNITY): Payer: Self-pay

## 2023-11-12 LAB — CERVICOVAGINAL ANCILLARY ONLY
Chlamydia: POSITIVE — AB
Comment: NEGATIVE
Comment: NEGATIVE
Comment: NORMAL
Neisseria Gonorrhea: NEGATIVE
Trichomonas: NEGATIVE

## 2023-11-12 LAB — RPR: RPR Ser Ql: NONREACTIVE

## 2023-11-12 MED ORDER — DOXYCYCLINE HYCLATE 100 MG PO TABS: 100.0000 mg | ORAL_TABLET | Freq: Two times a day (BID) | ORAL | 0 refills | Status: AC

## 2024-02-06 ENCOUNTER — Encounter: Payer: Self-pay | Admitting: Emergency Medicine

## 2024-02-06 ENCOUNTER — Ambulatory Visit
Admission: EM | Admit: 2024-02-06 | Discharge: 2024-02-06 | Disposition: A | Discharge status: Home / Self Care | Attending: Emergency Medicine | Admitting: Emergency Medicine

## 2024-02-06 DIAGNOSIS — N898 Other specified noninflammatory disorders of vagina: Secondary | ICD-10-CM | POA: Diagnosis present

## 2024-02-06 DIAGNOSIS — Z113 Encounter for screening for infections with a predominantly sexual mode of transmission: Secondary | ICD-10-CM | POA: Insufficient documentation

## 2024-02-06 DIAGNOSIS — R350 Frequency of micturition: Secondary | ICD-10-CM | POA: Insufficient documentation

## 2024-02-06 LAB — POCT URINE DIPSTICK
Bilirubin, UA: NEGATIVE
Blood, UA: NEGATIVE
Glucose, UA: NEGATIVE mg/dL
Ketones, POC UA: NEGATIVE mg/dL
Leukocytes, UA: NEGATIVE
Nitrite, UA: NEGATIVE
Protein Ur, POC: NEGATIVE mg/dL
Spec Grav, UA: 1.015 (ref 1.010–1.025)
Urobilinogen, UA: 0.2 U/dL
pH, UA: 8.5 — AB (ref 5.0–8.0)

## 2024-02-06 MED ORDER — MICONAZOLE NITRATE 2 % EX CREA: 1.0000 | TOPICAL_CREAM | Freq: Two times a day (BID) | CUTANEOUS | 0 refills | Status: AC

## 2024-02-06 MED ORDER — FLUCONAZOLE 150 MG PO TABS: 150.0000 mg | ORAL_TABLET | Freq: Once | ORAL | 1 refills | Status: AC

## 2024-02-06 NOTE — ED Provider Notes (Signed)
 HPI  SUBJECTIVE:  Brandy Molina is a 18 y.o. female who presents with 1 month of thick nonodorous white/yellow vaginal discharge, vaginal itching and urinary frequency.  No nausea, vomiting, abdominal, back, pelvic pain, abnormal vaginal bleeding, other urinary complaints.  She is in a new relationship with a female partner, who is asymptomatic, but states that the condom broke with her previous female sexual partner.  She would like STD testing.  She states that she took a Plan B and got her period  after this happened.  No aggravating or alleviating factors.  She has not tried anything for this.  She has a past medical history of chlamydia and vaginal yeast infections.  No history of diabetes, other STDs, UTI or BV.  LMP: The end of last month.  Denies the possibility of being pregnant. states we do not have to check.    Past Medical History:  Diagnosis Date   Allergic rhinitis    Allergy    Candidiasis, vulva    Depression    Dermatophytosis    Esophageal dysphagia    Generalized abdominal pain    GERD (gastroesophageal reflux disease)    Major depressive disorder     Past Surgical History:  Procedure Laterality Date   BIOPSY  02/01/2023   Procedure: BIOPSY;  Surgeon: Jinny Carmine, MD;  Location[facility];  Service: Endoscopy;;   COLONOSCOPY WITH PROPOFOL  N/A 02/01/2023   Procedure: COLONOSCOPY WITH PROPOFOL ;  Surgeon: Jinny Carmine, MD;  Location: [facility];  Service: Endoscopy;  Laterality: N/A;   TYMPANOPLASTY WITH GRAFT Left    TYMPANOSTOMY TUBE PLACEMENT Bilateral     Family History  Problem Relation Age of Onset   Hypertension Father    Depression Father    Depression Mother     Social History   Tobacco Use   Smoking status: Former    Current packs/day: 0.05    Average packs/day: 0.1 packs/day for 2.1 years (0.1 ttl pk-yrs)    Types: E-cigarettes, Cigarettes    Start date: 01/14/2022   Smokeless tobacco: Never  Vaping Use   Vaping status:  Every Day   Substances: Nicotine, Flavoring  Substance Use Topics   Alcohol use: No    Alcohol/week: 0.0 standard drinks of alcohol   Drug use: Yes    Types: Marijuana    No current facility-administered medications for this encounter.  Current Outpatient Medications:    fluconazole  (DIFLUCAN ) 150 MG tablet, Take 1 tablet (150 mg total) by mouth once for 1 dose. 1 tab po x 1. May repeat in 72 hours if no improvement, Disp: 2 tablet, Rfl: 1   miconazole  (MICOTIN) 2 % cream, Apply 1 Application topically 2 (two) times daily., Disp: 28.35 g, Rfl: 0   norelgestromin -ethinyl estradiol  (XULANE) 150-35 MCG/24HR transdermal patch, Place 1 patch onto the skin., Disp: , Rfl:   Allergies  Allergen Reactions   Amoxapine And Related     Pt unaware of any issues   Amoxicillin     Unknown.  Childhood reaction.     ROS  As noted in HPI.   Physical Exam  BP 111/75 (BP Location: Right Arm)   Pulse (!) 58   Temp 98 F (36.7 C) (Oral)   Resp 14   Ht 5' 4 (1.626 m)   Wt 62.2 kg   LMP 01/10/2024 (Approximate)   SpO2 97%   BMI 23.55 kg/m   Constitutional: Well developed, well nourished, no acute distress Eyes:  EOMI, conjunctiva normal bilaterally HENT:  Normocephalic, atraumatic,mucus membranes moist Respiratory: Normal inspiratory effort Cardiovascular: Normal rate GI: nondistended soft, nontender. No suprapubic tenderness  back: No CVA tenderness GU: Deferred skin: No rash, skin intact Musculoskeletal: no deformities Neurologic: Alert & oriented x 3, no focal neuro deficits Psychiatric: Speech and behavior appropriate   ED Course   Medications - No data to display  Orders Placed This Encounter  Procedures   RPR    Standing Status:   Standing    Number of Occurrences:   1   HIV Antibody (routine testing w rflx)    Standing Status:   Standing    Number of Occurrences:   1   POC Urinalysis Dipstick    Standing Status:   Standing    Number of Occurrences:   1     Results for orders placed or performed during the hospital encounter of 02/06/24 (from the past 24 hours)  POC Urinalysis Dipstick     Status: Abnormal   Collection Time: 02/06/24  3:20 PM  Result Value Ref Range   Color, UA yellow yellow   Clarity, UA clear clear   Glucose, UA negative negative mg/dL   Bilirubin, UA negative negative   Ketones, POC UA negative negative mg/dL   Spec Grav, UA 8.984 8.989 - 1.025   Blood, UA negative negative   pH, UA 8.5 (A) 5.0 - 8.0   Protein Ur, POC negative negative mg/dL   Urobilinogen, UA 0.2 0.2 or 1.0 E.U./dL   Nitrite, UA Negative Negative   Leukocytes, UA Negative Negative   No results found.  ED Clinical Impression  1. Vaginal discharge   2. Urinary frequency   3. Vaginal itching   4. Screening for STDs (sexually transmitted diseases)      ED Assessment/Plan    UA negative for UTI.  Discussed with patient while in department.  She declined pregnancy testing.  H&P most c/w yeast infection. Sent off swab for STDs, BV, yeast, HIV, RPR. Will not treat empirically now for STIs. Will send home with diflucan  and miconazole  cream for yeast infection. Advised pt to refrain from sexual contact until all lab results back, symptoms resolve, and partner(s) are treated if necessary. Pt provided working phone number. Follow-up with PCP as needed. Discussed labs, MDM, plan and followup with patient. Pt agrees with plan.   Meds ordered this encounter  Medications   fluconazole  (DIFLUCAN ) 150 MG tablet    Sig: Take 1 tablet (150 mg total) by mouth once for 1 dose. 1 tab po x 1. May repeat in 72 hours if no improvement    Dispense:  2 tablet    Refill:  1   miconazole  (MICOTIN) 2 % cream    Sig: Apply 1 Application topically 2 (two) times daily.    Dispense:  28.35 g    Refill:  0    *This clinic note was created using Scientist, clinical (histocompatibility and immunogenetics). Therefore, there may be occasional mistakes despite careful proofreading.  ?      Van Knee, MD 02/07/24 [phone]

## 2024-02-06 NOTE — ED Triage Notes (Signed)
 Patient reports ongoing vaginal discharge for a month.  Patient reports some itching and reports whitish and yellow vaginal discharge.

## 2024-02-06 NOTE — Discharge Instructions (Signed)
 Take the medication as written.  We are checking for gonorrhea, chlamydia, HIV, syphilis, trichomonas, bacterial vaginosis and yeast infection.  I am treating you for a yeast infection today.  Give us  a working phone number so that we can contact you if needed. Refrain from sexual contact until all of your labs have come back, symptoms have resolved, and your partner(s) are treated if necessary. Return to the ER if you get worse, have a fever >100.4, or for any concerns.   Go to www.goodrx.com  or www.costplusdrugs.com to look up your medications. This will give you a list of where you can find your prescriptions at the most affordable prices. Or ask the pharmacist what the cash price is, or if they have any other discount programs available to help make your medication more affordable. This can be less expensive than what you would pay with insurance.

## 2024-02-07 ENCOUNTER — Ambulatory Visit (HOSPITAL_COMMUNITY): Payer: Self-pay

## 2024-02-07 LAB — HIV ANTIBODY (ROUTINE TESTING W REFLEX): HIV Screen 4th Generation wRfx: NONREACTIVE

## 2024-02-07 LAB — CERVICOVAGINAL ANCILLARY ONLY
Bacterial Vaginitis (gardnerella): POSITIVE — AB
Candida Glabrata: NEGATIVE
Candida Vaginitis: POSITIVE — AB
Chlamydia: POSITIVE — AB
Comment: NEGATIVE
Comment: NEGATIVE
Comment: NEGATIVE
Comment: NEGATIVE
Comment: NEGATIVE
Comment: NORMAL
Neisseria Gonorrhea: NEGATIVE
Trichomonas: NEGATIVE

## 2024-02-07 LAB — SYPHILIS: RPR W/REFLEX TO RPR TITER AND TREPONEMAL ANTIBODIES, TRADITIONAL SCREENING AND DIAGNOSIS ALGORITHM: RPR Ser Ql: NONREACTIVE

## 2024-02-07 MED ORDER — METRONIDAZOLE 500 MG PO TABS: 500.0000 mg | ORAL_TABLET | Freq: Two times a day (BID) | ORAL | 0 refills | Status: AC

## 2024-02-07 MED ORDER — DOXYCYCLINE HYCLATE 100 MG PO TABS: 100.0000 mg | ORAL_TABLET | Freq: Two times a day (BID) | ORAL | 0 refills | Status: AC

## 2024-02-11 ENCOUNTER — Telehealth: Payer: Self-pay

## 2024-02-11 NOTE — Telephone Encounter (Signed)
 Pt called stating that she has thrown up her first dose of doxy and flagyl . Wanted to know if she needed to return to have the medication refilled   Pt was informed that she should eat with dose of her medication to prevent upset stomach. That if she still has sx's after completion of current course of medications she can return to be re-evaluated. Pt verbalized understanding.
